# Patient Record
Sex: Male | Born: 1980 | Race: White | Hispanic: No | Marital: Single | Smoking: Former smoker
Health system: Southern US, Community
[De-identification: ages and names within clinical notes are randomized; demographics above are authoritative.]

## PROBLEM LIST (undated history)

## (undated) DIAGNOSIS — F32A Depression, unspecified: Secondary | ICD-10-CM

## (undated) DIAGNOSIS — F329 Major depressive disorder, single episode, unspecified: Secondary | ICD-10-CM

## (undated) DIAGNOSIS — R102 Pelvic and perineal pain: Secondary | ICD-10-CM

## (undated) DIAGNOSIS — G8929 Other chronic pain: Secondary | ICD-10-CM

## (undated) DIAGNOSIS — F419 Anxiety disorder, unspecified: Secondary | ICD-10-CM

## (undated) HISTORY — PX: REPLACEMENT TOTAL KNEE: SUR1224

---
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
## 2003-02-06 ENCOUNTER — Emergency Department (HOSPITAL_COMMUNITY): Admission: EM | Admit: 2003-02-06 | Discharge: 2003-02-07 | Payer: Self-pay | Admitting: *Deleted

## 2003-02-07 ENCOUNTER — Encounter: Payer: Self-pay | Admitting: *Deleted

## 2005-02-05 ENCOUNTER — Emergency Department (HOSPITAL_COMMUNITY): Admission: EM | Admit: 2005-02-05 | Discharge: 2005-02-05 | Payer: Self-pay | Admitting: Emergency Medicine

## 2007-02-20 ENCOUNTER — Emergency Department (HOSPITAL_COMMUNITY): Admission: EM | Admit: 2007-02-20 | Discharge: 2007-02-20 | Payer: Self-pay | Admitting: Family Medicine

## 2007-03-26 ENCOUNTER — Emergency Department (HOSPITAL_COMMUNITY): Admission: EM | Admit: 2007-03-26 | Discharge: 2007-03-26 | Payer: Self-pay | Admitting: Emergency Medicine

## 2008-10-04 ENCOUNTER — Emergency Department (HOSPITAL_COMMUNITY): Admission: EM | Admit: 2008-10-04 | Discharge: 2008-10-04 | Payer: Self-pay | Admitting: Emergency Medicine

## 2010-10-12 LAB — BASIC METABOLIC PANEL
BUN: 14 mg/dL (ref 6–23)
Chloride: 107 mEq/L (ref 96–112)
Creatinine, Ser: 1.01 mg/dL (ref 0.4–1.5)
Glucose, Bld: 89 mg/dL (ref 70–99)
Potassium: 3.9 mEq/L (ref 3.5–5.1)

## 2010-10-12 LAB — RAPID URINE DRUG SCREEN, HOSP PERFORMED
Amphetamines: NOT DETECTED
Barbiturates: NOT DETECTED
Cocaine: NOT DETECTED
Opiates: NOT DETECTED

## 2010-10-12 LAB — DIFFERENTIAL
Basophils Absolute: 0 10*3/uL (ref 0.0–0.1)
Eosinophils Relative: 3 % (ref 0–5)
Monocytes Absolute: 0.7 10*3/uL (ref 0.1–1.0)
Monocytes Relative: 7 % (ref 3–12)
Neutro Abs: 4.7 10*3/uL (ref 1.7–7.7)
Neutrophils Relative %: 50 % (ref 43–77)

## 2010-10-12 LAB — CBC
Hemoglobin: 16.2 g/dL (ref 13.0–17.0)
WBC: 9.4 10*3/uL (ref 4.0–10.5)

## 2010-10-12 LAB — ETHANOL: Alcohol, Ethyl (B): 32 mg/dL — ABNORMAL HIGH (ref 0–10)

## 2011-05-13 ENCOUNTER — Emergency Department (INDEPENDENT_AMBULATORY_CARE_PROVIDER_SITE_OTHER)
Admission: EM | Admit: 2011-05-13 | Discharge: 2011-05-13 | Disposition: A | Discharge status: Other Acute Inpatient Hospital | Payer: BC Managed Care – PPO | Source: Home / Self Care | Attending: Emergency Medicine | Admitting: Emergency Medicine

## 2011-05-13 ENCOUNTER — Encounter (HOSPITAL_COMMUNITY): Payer: Self-pay | Admitting: *Deleted

## 2011-05-13 DIAGNOSIS — R300 Dysuria: Secondary | ICD-10-CM

## 2011-05-13 LAB — POCT URINALYSIS DIP (DEVICE)
Glucose, UA: 100 mg/dL — AB
Nitrite: POSITIVE — AB
Protein, ur: 30 mg/dL — AB
Urobilinogen, UA: 2 mg/dL — ABNORMAL HIGH (ref 0.0–1.0)
pH: 5 (ref 5.0–8.0)

## 2011-05-13 MED ORDER — SEPTRA DS 800-160 MG PO TABS: 1.0000 | ORAL_TABLET | Freq: Two times a day (BID) | ORAL | Status: AC

## 2011-05-13 NOTE — ED Provider Notes (Signed)
History     CSN: [id number] Arrival date & time: 05/13/2011  2:56 PM   First MD Initiated Contact with Patient 05/13/11 1443      Chief Complaint  Patient presents with  . Urinary Frequency    pt with c/o urinary urgency - denies penile discharge -per pt small red area scrotum    (Consider location/radiation/quality/duration/timing/severity/associated sxs/prior treatment) HPI  History reviewed. No pertinent past medical history.  No past surgical history on file.  No family history on file.  History  Substance Use Topics  . Smoking status: Not on file  . Smokeless tobacco: Not on file  . Alcohol Use: Not on file      Review of Systems  Allergies  Review of patient's allergies indicates no known allergies.  Home Medications  No current outpatient prescriptions on file.  BP 148/90  Pulse 92  Temp(Src) 98.4 F (36.9 C) (Oral)  Resp 16  SpO2 97%  Physical Exam  ED Course  Procedures (including critical care time)  Labs Reviewed  POCT URINALYSIS DIP (DEVICE) - Abnormal; Notable for the following:    Glucose, UA 100 (*)    Bilirubin Urine SMALL (*)    Ketones, ur 15 (*)    Protein, ur 30 (*)    Urobilinogen, UA 2.0 (*)    Nitrite POSITIVE (*)    Leukocytes, UA LARGE (*) Biochemical Testing Only. Please order routine urinalysis from main lab if confirmatory testing is needed.   All other components within normal limits  GC/CHLAMYDIA PROBE AMP, GENITAL  POCT URINALYSIS DIPSTICK   No results found.   No diagnosis found.    MDM  Dysuria-        Jimmie Molly, MD 05/13/11 [phone]

## 2011-05-13 NOTE — Discharge Instructions (Signed)
Dysuria. As discussed we will contact you, if any abnormal culture results from urine and urethral studies- Dysuria is the medical term for pain with urination. There are many causes for dysuria, but urinary tract infection is the most common. If a urinalysis was performed it can show that there is a urinary tract infection. A urine culture confirms that you or your child is sick. You will need to follow up with a healthcare provider because:  If a urine culture was done you will need to know the culture results and treatment recommendations.   If the urine culture was positive, you or your child will need to be put on antibiotics or know if the antibiotics prescribed are the right antibiotics for your urinary tract infection.   If the urine culture is negative (no urinary tract infection), then other causes may need to be explored or antibiotics need to be stopped.  Today laboratory work may have been done and there does not seem to be an infection. If cultures were done they will take at least 24 to 48 hours to be completed. Today x-rays may have been taken and they read as normal. No cause can be found for the problems. The x-rays may be re-read by a radiologist and you will be contacted if additional findings are made. You or your child may have been put on medications to help with this problem until you can see your primary caregiver. If the problems get better, see your primary caregiver if the problems return. If you were given antibiotics (medications which kill germs), take all of the mediations as directed for the full course of treatment.  If laboratory work was done, you need to find the results. Leave a telephone number where you can be reached. If this is not possible, make sure you find out how you are to get test results. HOME CARE INSTRUCTIONS   Drink lots of fluids. For adults, drink eight, 8 ounce glasses of clear juice or water a day. For children, replace fluids as suggested by  your caregiver.   Empty the bladder often. Avoid holding urine for long periods of time.   After a bowel movement, women should cleanse front to back, using each tissue only once.   Empty your bladder before and after sexual intercourse.   Take all the medicine given to you until it is gone. You may feel better in a few days, but TAKE ALL MEDICINE.   Avoid caffeine, tea, alcohol and carbonated beverages, because they tend to irritate the bladder.   In men, alcohol may irritate the prostate.   Only take over-the-counter or prescription medicines for pain, discomfort, or fever as directed by your caregiver.   If your caregiver has given you a follow-up appointment, it is very important to keep that appointment. Not keeping the appointment could result in a chronic or permanent injury, pain, and disability. If there is any problem keeping the appointment, you must call back to this facility for assistance.  SEEK IMMEDIATE MEDICAL CARE IF:   Back pain develops.   A fever develops.   There is nausea (feeling sick to your stomach) or vomiting (throwing up).   Problems are no better with medications or are getting worse.  MAKE SURE YOU:   Understand these instructions.   Will watch your condition.   Will get help right away if you are not doing well or get worse.  Document Released: 03/17/2004 Document Revised: 03/01/2011 Document Reviewed: 01/23/2008 [employer] Patient Information 2012  [employer] °

## 2011-05-15 LAB — URINE CULTURE: Colony Count: NO GROWTH

## 2011-05-15 LAB — GC/CHLAMYDIA PROBE AMP, GENITAL: Chlamydia, DNA Probe: NEGATIVE

## 2011-05-17 ENCOUNTER — Telehealth (HOSPITAL_COMMUNITY): Payer: Self-pay | Admitting: *Deleted

## 2011-06-02 ENCOUNTER — Other Ambulatory Visit: Payer: Self-pay | Admitting: Urology

## 2011-06-02 DIAGNOSIS — M5126 Other intervertebral disc displacement, lumbar region: Secondary | ICD-10-CM

## 2011-06-02 DIAGNOSIS — N50819 Testicular pain, unspecified: Secondary | ICD-10-CM

## 2011-06-02 DIAGNOSIS — R200 Anesthesia of skin: Secondary | ICD-10-CM

## 2011-06-04 ENCOUNTER — Ambulatory Visit (HOSPITAL_COMMUNITY): Payer: BC Managed Care – PPO

## 2011-07-04 HISTORY — PX: COLONOSCOPY: SHX174

## 2011-07-04 HISTORY — PX: OTHER SURGICAL HISTORY: SHX169

## 2012-01-20 ENCOUNTER — Emergency Department (HOSPITAL_COMMUNITY)
Admission: EM | Admit: 2012-01-20 | Discharge: 2012-01-20 | Discharge status: Absent without leave | Payer: Self-pay | Source: Home / Self Care | Attending: Emergency Medicine | Admitting: Emergency Medicine

## 2012-01-20 ENCOUNTER — Encounter (HOSPITAL_COMMUNITY): Payer: Self-pay | Admitting: *Deleted

## 2012-01-20 ENCOUNTER — Emergency Department (HOSPITAL_COMMUNITY)
Admission: EM | Admit: 2012-01-20 | Discharge: 2012-01-20 | Disposition: A | Discharge status: Home / Self Care | Payer: Worker's Compensation | Attending: Emergency Medicine | Admitting: Emergency Medicine

## 2012-01-20 DIAGNOSIS — T310 Burns involving less than 10% of body surface: Secondary | ICD-10-CM

## 2012-01-20 DIAGNOSIS — T300 Burn of unspecified body region, unspecified degree: Secondary | ICD-10-CM

## 2012-01-20 DIAGNOSIS — X19XXXA Contact with other heat and hot substances, initial encounter: Secondary | ICD-10-CM | POA: Insufficient documentation

## 2012-01-20 DIAGNOSIS — T22119A Burn of first degree of unspecified forearm, initial encounter: Secondary | ICD-10-CM | POA: Insufficient documentation

## 2012-01-20 HISTORY — DX: Other chronic pain: G89.29

## 2012-01-20 HISTORY — DX: Pelvic and perineal pain: R10.2

## 2012-01-20 MED ORDER — NAPROXEN 500 MG PO TABS: 500.0000 mg | ORAL_TABLET | Freq: Two times a day (BID) | ORAL | Status: AC

## 2012-01-20 MED ORDER — HYDROCODONE-ACETAMINOPHEN 5-325 MG PO TABS: 1.0000 | ORAL_TABLET | Freq: Four times a day (QID) | ORAL | Status: AC | PRN

## 2012-01-20 MED ORDER — HYDROMORPHONE HCL PF 2 MG/ML IJ SOLN
2.0000 mg | Freq: Once | INTRAMUSCULAR | Status: AC
  Administered 2012-01-20: 2 mg via INTRAMUSCULAR
  Filled 2012-01-20: qty 2

## 2012-01-20 NOTE — ED Provider Notes (Signed)
History  Scribed for Shelda Jakes, MD, the patient was seen in room TR05C/TR05C. This chart was scribed by Candelaria Stagers. The patient's care started at 6:54 PM   CSN: [id number]  Arrival date & time 01/20/12  1734   First MD Initiated Contact with Patient 01/20/12 1820      Chief Complaint  Patient presents with  . Burn     The history is provided by the patient.   Andrew Taylor is a 31 y.o. male who presents to the Emergency Department complaining of a burn to his right forearm after a radiator exploded while at work.  He is experiencing no blistering and skin is intact.  Pt was wearing protective gear.   Past Medical History  Diagnosis Date  . Chronic male pelvic pain     History reviewed. No pertinent past surgical history.  History reviewed. No pertinent family history.  History  Substance Use Topics  . Smoking status: Not on file  . Smokeless tobacco: Not on file  . Alcohol Use: Yes     occ      Review of Systems  Skin:       Burn to his right forearm.   All other systems reviewed and are negative.    Allergies  Review of patient's allergies indicates no known allergies.  Home Medications   Current Outpatient Rx  Name Route Sig Dispense Refill  . ALPRAZOLAM ER 3 MG PO TB24 Oral Take 3 mg by mouth every morning.    Marland Kitchen AMITRIPTYLINE HCL 25 MG PO TABS Oral Take 50 mg by mouth at bedtime.    Marland Kitchen HYDROCODONE-ACETAMINOPHEN 5-325 MG PO TABS Oral Take 1-2 tablets by mouth every 6 (six) hours as needed for pain. 10 tablet 0  . NAPROXEN 500 MG PO TABS Oral Take 1 tablet (500 mg total) by mouth 2 (two) times daily. 14 tablet 0    BP 151/85  Pulse 105  Temp 97.9 F (36.6 C) (Oral)  Resp 19  SpO2 99%  Physical Exam  Nursing note and vitals reviewed. Constitutional: He is oriented to person, place, and time. He appears well-developed and well-nourished. No distress.  HENT:  Head: Normocephalic and atraumatic.  Eyes: EOM are normal.    Cardiovascular: Normal rate and regular rhythm.   No murmur heard. Pulmonary/Chest: Breath sounds normal. He has no wheezes. He has no rales.  Abdominal: Bowel sounds are normal. There is no tenderness.  Musculoskeletal: Normal range of motion. He exhibits no tenderness.  Neurological: He is alert and oriented to person, place, and time. No cranial nerve deficit.  Skin: He is not diaphoretic.       Burn to 3-4% of body surface located on the right arm.  No significant blistering.  Cap refill in fingers 1 sec to right hand.  Burn to posterior hypothenar.      Psychiatric: He has a normal mood and affect. His behavior is normal.    ED Course  Procedures   DIAGNOSTIC STUDIES: Oxygen Saturation is 99% on room air, normal by my interpretation.    COORDINATION OF CARE:     Labs Reviewed - No data to display No results found.   1. Burn any degree involving less than 10 percent of body surface   2. First degree burn       MDM  Right forearm first degree burn about 4% body surface area minimal involvement of the hand on the back of the hand between the thumb and index finger.  Rare blistering almost all predominantly first degree. Tetanus is up-to-date patient improved with pain medicine the emergency department patient will be sent home with pain medicine anti-inflammatories and precautions.     I personally performed the services described in this documentation, which was scribed in my presence. The recorded information has been reviewed and considered.          Shelda Jakes, MD 01/20/12 [phone]

## 2012-01-20 NOTE — ED Notes (Signed)
Pt has radiator burn to right arm from wrist to upper arm.

## 2012-08-30 ENCOUNTER — Emergency Department (HOSPITAL_COMMUNITY)
Admission: EM | Admit: 2012-08-30 | Discharge: 2012-08-30 | Disposition: A | Discharge status: Home / Self Care | Payer: BC Managed Care – PPO | Source: Home / Self Care | Attending: Emergency Medicine | Admitting: Emergency Medicine

## 2012-08-30 ENCOUNTER — Encounter (HOSPITAL_COMMUNITY): Payer: Self-pay | Admitting: Emergency Medicine

## 2012-08-30 DIAGNOSIS — L738 Other specified follicular disorders: Secondary | ICD-10-CM

## 2012-08-30 DIAGNOSIS — L739 Follicular disorder, unspecified: Secondary | ICD-10-CM

## 2012-08-30 MED ORDER — DOXYCYCLINE HYCLATE 100 MG PO TABS: 100.0000 mg | ORAL_TABLET | Freq: Two times a day (BID) | ORAL | Status: DC

## 2012-08-30 NOTE — ED Provider Notes (Signed)
Chief Complaint  Patient presents with  . Rash    History of Present Illness:   Andrew Taylor is a 32 year old male who sustained a small cut on his right index finger a week ago. This has healed up, but ever since then he's had some red bumps on his right hand, one in his chest, and is also concerned about a couple of lesions on his penis. He thinks these are warts which are are spreading from the cut in the hand. The lesions are little bit itchy but not painful. He denies any fever or chills. The wound on the index finger has healed up completely. He's had no other GU complaints including urethral discharge or ulcers on the penis. He is in a mutually monogamous relationship with one single male partner. The patient has chronic pelvic floor dysfunction with pain and difficulty in urinating. He is seeing a urologist at [facility] and takes multiple medications including Xanax, Cialis, Valium rectal suppositories, tramadol, and amitriptyline.  Review of Systems:  Other than noted above, the patient denies any of the following symptoms: Systemic:  No fever, chills, sweats, weight loss, or fatigue. ENT:  No nasal congestion, rhinorrhea, sore throat, swelling of lips, tongue or throat. Resp:  No cough, wheezing, or shortness of breath. Skin:  No rash, itching, nodules, or suspicious lesions.  PMFSH:  Past medical history, family history, social history, meds, and allergies were reviewed.  Physical Exam:   Vital signs:  BP 151/81  Pulse 94  Temp(Src) 98.1 F (36.7 C) (Oral)  Resp 16  SpO2 100% Gen:  Alert, oriented, in no distress. ENT:  Pharynx clear, no intraoral lesions, moist mucous membranes. Lungs:  Clear to auscultation. Skin:  The laceration the right index finger has healed up completely. He has a couple of red bumps on the dorsum of his right hand. He's been somewhat medicine on this so the skin around them as white and somewhat raised. He has a tiny red bump on his chest.  The lesions on his penis are almost microscopic. They are not warts. There tiny white areas of unknown significance. None of these lesions look like warts.  Other Labs Obtained at Urgent Care Center:  Since he is concerned about the possibility of HSV 1 HSV 2, titers were obtained.  Results are pending at this time and we will call about any positive results.  Assessment:  The encounter diagnosis was Folliculitis.  I think he may have mild folliculitis from bacteria when he had the cut, but nothing severe and definitely no warts.  Plan:   1.  The following meds were prescribed:   Discharge Medication List as of 08/30/2012  6:16 PM    START taking these medications   Details  doxycycline (VIBRA-TABS) 100 MG tablet Take 1 tablet (100 mg total) by mouth 2 (two) times daily., Starting 08/30/2012, Until Discontinued, Normal       2.  The patient was instructed in symptomatic care and handouts were given. 3.  The patient was told to return if becoming worse in any way, if no better in 3 or 4 days, and given some red flag symptoms that would indicate earlier return.     Reuben Likes, MD 08/30/12 [phone]

## 2012-08-30 NOTE — ED Notes (Addendum)
Had cut on his hand last Sat.  Next day he noted white round flat patch on R hand, base of R thumb, chest, and penis.  One on R index finger went away after using wart removal. C/o itching.

## 2012-09-02 LAB — HSV 2 ANTIBODY, IGG: HSV 2 Glycoprotein G Ab, IgG: <0.1 IV

## 2012-09-02 LAB — HSV 1 ANTIBODY, IGG: HSV 1 Glycoprotein G Ab, IgG: 0.1 IV

## 2012-09-03 ENCOUNTER — Telehealth (HOSPITAL_COMMUNITY): Payer: Self-pay | Admitting: *Deleted

## 2012-09-03 NOTE — ED Notes (Signed)
HSV 1 0.10 neg., HSV 2 <0.10 neg.  Labs sent to Dr. Lorenz Coaster. He asked me to notify pt. of neg. results due to his anxiety about it.  I called and verified with person that answered that I had dialed correctly and was told it was a wrong number. I called and left message with contact (father) for pt. to call. [name] 09/03/2012

## 2012-09-04 ENCOUNTER — Telehealth (HOSPITAL_COMMUNITY): Payer: Self-pay | Admitting: *Deleted

## 2012-09-09 NOTE — ED Notes (Signed)
Chart review.

## 2012-09-10 ENCOUNTER — Telehealth (HOSPITAL_COMMUNITY): Payer: Self-pay | Admitting: *Deleted

## 2012-09-10 NOTE — ED Notes (Signed)
Unable to reach pt. by phone.  Pt.'s mobile number incorrect.  Left message with contact x 3.  Letter sent informing pt. that his labs were negative. [name] 09/10/2012

## 2012-11-09 ENCOUNTER — Emergency Department (HOSPITAL_COMMUNITY): Payer: Worker's Compensation

## 2012-11-09 ENCOUNTER — Encounter (HOSPITAL_COMMUNITY): Payer: Self-pay | Admitting: Emergency Medicine

## 2012-11-09 ENCOUNTER — Emergency Department (HOSPITAL_COMMUNITY)
Admission: EM | Admit: 2012-11-09 | Discharge: 2012-11-09 | Disposition: A | Discharge status: Home / Self Care | Payer: Worker's Compensation | Attending: Emergency Medicine | Admitting: Emergency Medicine

## 2012-11-09 DIAGNOSIS — S838X9A Sprain of other specified parts of unspecified knee, initial encounter: Secondary | ICD-10-CM | POA: Insufficient documentation

## 2012-11-09 DIAGNOSIS — Y9289 Other specified places as the place of occurrence of the external cause: Secondary | ICD-10-CM | POA: Insufficient documentation

## 2012-11-09 DIAGNOSIS — G8929 Other chronic pain: Secondary | ICD-10-CM | POA: Insufficient documentation

## 2012-11-09 DIAGNOSIS — S83422A Sprain of lateral collateral ligament of left knee, initial encounter: Secondary | ICD-10-CM

## 2012-11-09 DIAGNOSIS — X500XXA Overexertion from strenuous movement or load, initial encounter: Secondary | ICD-10-CM | POA: Insufficient documentation

## 2012-11-09 DIAGNOSIS — S72412A Displaced unspecified condyle fracture of lower end of left femur, initial encounter for closed fracture: Secondary | ICD-10-CM

## 2012-11-09 DIAGNOSIS — Z79899 Other long term (current) drug therapy: Secondary | ICD-10-CM | POA: Insufficient documentation

## 2012-11-09 DIAGNOSIS — F411 Generalized anxiety disorder: Secondary | ICD-10-CM | POA: Insufficient documentation

## 2012-11-09 DIAGNOSIS — F329 Major depressive disorder, single episode, unspecified: Secondary | ICD-10-CM | POA: Insufficient documentation

## 2012-11-09 DIAGNOSIS — F3289 Other specified depressive episodes: Secondary | ICD-10-CM | POA: Insufficient documentation

## 2012-11-09 DIAGNOSIS — Y9389 Activity, other specified: Secondary | ICD-10-CM | POA: Insufficient documentation

## 2012-11-09 DIAGNOSIS — Z87891 Personal history of nicotine dependence: Secondary | ICD-10-CM | POA: Insufficient documentation

## 2012-11-09 DIAGNOSIS — S72413A Displaced unspecified condyle fracture of lower end of unspecified femur, initial encounter for closed fracture: Secondary | ICD-10-CM | POA: Insufficient documentation

## 2012-11-09 HISTORY — DX: Depression, unspecified: F32.A

## 2012-11-09 HISTORY — DX: Major depressive disorder, single episode, unspecified: F32.9

## 2012-11-09 HISTORY — DX: Anxiety disorder, unspecified: F41.9

## 2012-11-09 MED ORDER — HYDROCODONE-ACETAMINOPHEN 5-325 MG PO TABS
1.0000 | ORAL_TABLET | Freq: Once | ORAL | Status: AC
  Administered 2012-11-09: 1 via ORAL
  Filled 2012-11-09: qty 1

## 2012-11-09 MED ORDER — HYDROCODONE-ACETAMINOPHEN 5-325 MG PO TABS: 1.0000 | ORAL_TABLET | ORAL | Status: DC | PRN

## 2012-11-09 NOTE — ED Provider Notes (Signed)
History     CSN: [id number]  Arrival date & time 11/09/12  1258   First MD Initiated Contact with Patient 11/09/12 1333      Chief Complaint  Patient presents with  . Knee Pain    r/knee pain x 3 hrs. Pt stated that r/knee pain occured after he turned sharply to the right and knee did not move    (Consider location/radiation/quality/duration/timing/severity/associated sxs/prior treatment) HPI  This is a 32 year old male presenting to the emergency department after injuring his right knee at work. Patient states he was standing up working on a car when he turned to grab a tool and felt his right knee nocturnal with him. He states he heard a loud pop and immediately fell to the ground. Patient states he was unable to stand after the injury. He had sharp pain in the center of his knee without radiation that rates it a 9/10. He noticed immediate swelling without bruising. Denies any previous injury to knee. Came immediately to the emergency department.   Past Medical History  Diagnosis Date  . Chronic male pelvic pain   . Anxiety   . Depression     Past Surgical History  Procedure Laterality Date  . Colonoscopy  2013  . Peniloscopy  2013    Family History  Problem Relation Age of Onset  . Hypertension Father     History  Substance Use Topics  . Smoking status: Former Games developer  . Smokeless tobacco: Not on file  . Alcohol Use: Yes     Comment: occ      Review of Systems  Musculoskeletal:       Knee pain  All other systems reviewed and are negative.    Allergies  Review of patient's allergies indicates no known allergies.  Home Medications   Current Outpatient Rx  Name  Route  Sig  Dispense  Refill  . ALPRAZolam (XANAX XR) 3 MG 24 hr tablet   Oral   Take 3 mg by mouth every morning.         Marland Kitchen amitriptyline (ELAVIL) 25 MG tablet   Oral   Take 50 mg by mouth at bedtime.         . cyclobenzaprine (FLEXERIL) 10 MG tablet   Oral   Take 10 mg by mouth 3  (three) times daily as needed for muscle spasms.         Marland Kitchen doxycycline (VIBRA-TABS) 100 MG tablet   Oral   Take 1 tablet (100 mg total) by mouth 2 (two) times daily.   20 tablet   0   . tadalafil (CIALIS) 5 MG tablet   Oral   Take 5 mg by mouth daily as needed for erectile dysfunction.         . traMADol (ULTRAM) 50 MG tablet   Oral   Take 50 mg by mouth every 6 (six) hours as needed for pain.         . naproxen (NAPROSYN) 500 MG tablet   Oral   Take 1 tablet (500 mg total) by mouth 2 (two) times daily.   14 tablet   0     BP 129/93  Pulse 100  Temp(Src) 98.2 F (36.8 C)  Resp 20  Wt 199 lb (90.266 kg)  SpO2 100%  Physical Exam  Constitutional: He is oriented to person, place, and time. He appears well-developed and well-nourished.  HENT:  Head: Normocephalic and atraumatic.  Eyes: EOM are normal. Pupils are equal, round, and reactive  to light.  Cardiovascular: Normal rate, regular rhythm and normal heart sounds.   Pulmonary/Chest: Effort normal and breath sounds normal.  Abdominal: Soft. Bowel sounds are normal.  Musculoskeletal:       Right knee: He exhibits decreased range of motion, swelling, LCL laxity and bony tenderness. He exhibits no ecchymosis, no deformity, no laceration and no erythema. Tenderness found. LCL tenderness noted.       Left knee: Normal.       Right ankle: Normal.       Left ankle: Normal.  Unable to bear weight on right leg.   Neurological: He is alert and oriented to person, place, and time.  Skin: Skin is warm and dry.  Psychiatric: He has a normal mood and affect.    ED Course  Procedures (including critical care time)  Labs Reviewed - No data to display Dg Knee Complete 4 Views Right  11/09/2012  *RADIOLOGY REPORT*  Clinical Data: Twisting injury to right knee with pain and inability to bear weight.  RIGHT KNEE - COMPLETE 4+ VIEW  Comparison: None.  Findings: There is an avulsion fracture adjacent to the lateral femoral  condyle, likely reflecting injury at the level of the lateral collateral ligament insertion.  There is associated joint fluid in the suprapatellar region.  No other injuries are identified.  IMPRESSION: Lateral femoral condyle avulsion fracture, likely at the level of LCL insertion.   Original Report Authenticated By: Irish Lack, M.D.      1. Knee LCL sprain, left, initial encounter   2. Femoral condyle fracture, left, closed, initial encounter       MDM  Patient X-Ray positive for femoral condylar avulsion fracture. Pain managed in ED. Pt advised to follow up with orthopedics for followup. Patient given brace and crutches while in ED, DC'd with home pain meds, discussed rice protocol and discussed importance of following up with orthopedist. Patient will be dc home & is agreeable with above plan.         Jeannetta Ellis, PA-C 11/09/12 [phone]

## 2012-11-09 NOTE — ED Notes (Signed)
C/o r/ knee pain after he twisted it at work. Pain caused him to fall onto r/knee and shoulder. Abrasion on r/hand noted

## 2012-11-09 NOTE — ED Notes (Signed)
[id number]> Expected date:<BR> Expected time:<BR> Means of arrival:<BR> Comments:<BR> Hold for ems patient

## 2012-11-09 NOTE — ED Notes (Signed)
Patient transported to X-ray 

## 2012-11-10 NOTE — ED Provider Notes (Signed)
Medical screening examination/treatment/procedure(s) were performed by non-physician practitioner and as supervising physician I was immediately available for consultation/collaboration.  Nedda Gains, MD 11/10/12 0819 

## 2014-11-15 ENCOUNTER — Encounter (HOSPITAL_BASED_OUTPATIENT_CLINIC_OR_DEPARTMENT_OTHER): Payer: Self-pay | Admitting: *Deleted

## 2014-11-15 ENCOUNTER — Emergency Department (HOSPITAL_BASED_OUTPATIENT_CLINIC_OR_DEPARTMENT_OTHER)
Admission: EM | Admit: 2014-11-15 | Discharge: 2014-11-15 | Disposition: A | Discharge status: Home / Self Care | Payer: BLUE CROSS/BLUE SHIELD | Attending: Emergency Medicine | Admitting: Emergency Medicine

## 2014-11-15 ENCOUNTER — Emergency Department (HOSPITAL_BASED_OUTPATIENT_CLINIC_OR_DEPARTMENT_OTHER): Payer: BLUE CROSS/BLUE SHIELD

## 2014-11-15 DIAGNOSIS — R0602 Shortness of breath: Secondary | ICD-10-CM | POA: Insufficient documentation

## 2014-11-15 DIAGNOSIS — F329 Major depressive disorder, single episode, unspecified: Secondary | ICD-10-CM | POA: Diagnosis not present

## 2014-11-15 DIAGNOSIS — R000 Tachycardia, unspecified: Secondary | ICD-10-CM | POA: Diagnosis not present

## 2014-11-15 DIAGNOSIS — M79669 Pain in unspecified lower leg: Secondary | ICD-10-CM | POA: Diagnosis present

## 2014-11-15 DIAGNOSIS — R61 Generalized hyperhidrosis: Secondary | ICD-10-CM | POA: Diagnosis not present

## 2014-11-15 DIAGNOSIS — Z79899 Other long term (current) drug therapy: Secondary | ICD-10-CM | POA: Insufficient documentation

## 2014-11-15 DIAGNOSIS — G8929 Other chronic pain: Secondary | ICD-10-CM | POA: Insufficient documentation

## 2014-11-15 DIAGNOSIS — M791 Myalgia, unspecified site: Secondary | ICD-10-CM

## 2014-11-15 DIAGNOSIS — Z87891 Personal history of nicotine dependence: Secondary | ICD-10-CM | POA: Diagnosis not present

## 2014-11-15 DIAGNOSIS — F419 Anxiety disorder, unspecified: Secondary | ICD-10-CM | POA: Diagnosis not present

## 2014-11-15 LAB — CBC WITH DIFFERENTIAL/PLATELET
BASOS ABS: 0 10*3/uL (ref 0.0–0.1)
Basophils Relative: 0 % (ref 0–1)
EOS ABS: 0 10*3/uL (ref 0.0–0.7)
Eosinophils Relative: 0 % (ref 0–5)
HEMATOCRIT: 50 % (ref 39.0–52.0)
Hemoglobin: 17.2 g/dL — ABNORMAL HIGH (ref 13.0–17.0)
Lymphocytes Relative: 13 % (ref 12–46)
Lymphs Abs: 0.7 10*3/uL (ref 0.7–4.0)
MCH: 30.4 pg (ref 26.0–34.0)
MCHC: 34.4 g/dL (ref 30.0–36.0)
MCV: 88.5 fL (ref 78.0–100.0)
MONOS PCT: 8 % (ref 3–12)
Monocytes Absolute: 0.4 10*3/uL (ref 0.1–1.0)
Neutro Abs: 4.2 10*3/uL (ref 1.7–7.7)
Neutrophils Relative %: 79 % — ABNORMAL HIGH (ref 43–77)
PLATELETS: 111 10*3/uL — AB (ref 150–400)
RBC: 5.65 MIL/uL (ref 4.22–5.81)
RDW: 13 % (ref 11.5–15.5)
WBC: 5.3 10*3/uL (ref 4.0–10.5)

## 2014-11-15 LAB — COMPREHENSIVE METABOLIC PANEL
ALK PHOS: 49 U/L (ref 38–126)
ALT: 55 U/L (ref 17–63)
AST: 41 U/L (ref 15–41)
Albumin: 4.9 g/dL (ref 3.5–5.0)
Anion gap: 14 (ref 5–15)
BUN: 15 mg/dL (ref 6–20)
CO2: 27 mmol/L (ref 22–32)
CREATININE: 1.27 mg/dL — AB (ref 0.61–1.24)
Calcium: 10.1 mg/dL (ref 8.9–10.3)
Chloride: 98 mmol/L — ABNORMAL LOW (ref 101–111)
GFR calc Af Amer: >60 mL/min (ref >60)
GFR calc non Af Amer: >60 mL/min (ref >60)
Glucose, Bld: 115 mg/dL — ABNORMAL HIGH (ref 65–99)
Potassium: 3.9 mmol/L (ref 3.5–5.1)
Sodium: 139 mmol/L (ref 135–145)
Total Bilirubin: 0.7 mg/dL (ref 0.3–1.2)
Total Protein: 7.9 g/dL (ref 6.5–8.1)

## 2014-11-15 LAB — URINALYSIS, ROUTINE W REFLEX MICROSCOPIC
BILIRUBIN URINE: NEGATIVE
GLUCOSE, UA: NEGATIVE mg/dL
Hgb urine dipstick: NEGATIVE
KETONES UR: 15 mg/dL — AB
Leukocytes, UA: NEGATIVE
Nitrite: NEGATIVE
Protein, ur: NEGATIVE mg/dL
Specific Gravity, Urine: 1.028 (ref 1.005–1.030)
Urobilinogen, UA: 0.2 mg/dL (ref 0.0–1.0)
pH: 5.5 (ref 5.0–8.0)

## 2014-11-15 LAB — CK: Total CK: 89 U/L (ref 49–397)

## 2014-11-15 MED ORDER — KETOROLAC TROMETHAMINE 30 MG/ML IJ SOLN
30.0000 mg | Freq: Once | INTRAMUSCULAR | Status: AC
Start: 2014-11-15 — End: 2014-11-15
  Administered 2014-11-15: 30 mg via INTRAVENOUS
  Filled 2014-11-15: qty 1

## 2014-11-15 MED ORDER — NAPROXEN 500 MG PO TABS: 500.0000 mg | ORAL_TABLET | Freq: Two times a day (BID) | ORAL | Status: AC

## 2014-11-15 MED ORDER — CYCLOBENZAPRINE HCL 10 MG PO TABS: 10.0000 mg | ORAL_TABLET | Freq: Three times a day (TID) | ORAL | Status: AC | PRN

## 2014-11-15 MED ORDER — SODIUM CHLORIDE 0.9 % IV BOLUS (SEPSIS)
1000.0000 mL | Freq: Once | INTRAVENOUS | Status: AC
  Administered 2014-11-15: 1000 mL via INTRAVENOUS

## 2014-11-15 MED ORDER — MORPHINE SULFATE 4 MG/ML IJ SOLN
4.0000 mg | Freq: Once | INTRAMUSCULAR | Status: AC
  Administered 2014-11-15: 4 mg via INTRAVENOUS
  Filled 2014-11-15: qty 1

## 2014-11-15 MED ORDER — HYDROCODONE-ACETAMINOPHEN 5-325 MG PO TABS: 1.0000 | ORAL_TABLET | ORAL | Status: DC | PRN

## 2014-11-15 NOTE — ED Provider Notes (Signed)
CSN: [id number]     Arrival date & time 11/15/14  0725 History   First MD Initiated Contact with Patient 11/15/14 0732     Chief Complaint  Patient presents with  . lower body pain      (Consider location/radiation/quality/duration/timing/severity/associated sxs/prior Treatment) HPI 34 year old male presents with pelvic, buttock, and lower leg pain for the past 4 days. 6 days ago he felt a stinging sensation on his left shoulder and thought something bit him. He never noticed a mark or any bugs. He's been having itching in his shoulder and since then and fatigue that has been worsening over the past 1 week. Went to urgent care was diagnosed with shingles of his left shoulder (has never had a rash or burning pain). Started on valacyclovir. Patient change and he noticed some burning sensation to his right testicle and went back to the urgent care and was diagnosed with herpes. He states he has never had any rash or lesions and his genitalia. No burning with urination. States that urgent care took a blood test to confirm herpes. He has not received the results of this. He states that he's been having diffuse myalgias, fatigue, and shortness of breath on exertion. No chest pain, cough, or abdominal pain. Has been taking hydrocodone that he was given with minimal relief. Took some leftover oxycodone and states that did help until more off.  Past Medical History  Diagnosis Date  . Chronic male pelvic pain   . Anxiety   . Depression    Past Surgical History  Procedure Laterality Date  . Colonoscopy  2013  . Peniloscopy  2013  . Replacement total knee Right    Family History  Problem Relation Age of Onset  . Hypertension Father    History  Substance Use Topics  . Smoking status: Former Games developer  . Smokeless tobacco: Not on file  . Alcohol Use: Yes     Comment: occ    Review of Systems  Constitutional: Positive for diaphoresis and fatigue. Negative for fever.  Respiratory: Positive for  shortness of breath. Negative for cough.   Cardiovascular: Negative for chest pain and leg swelling.  Gastrointestinal: Negative for vomiting and abdominal pain.  Genitourinary: Negative for dysuria, discharge, penile pain and testicular pain.  Musculoskeletal: Positive for back pain.  Neurological: Negative for numbness.  All other systems reviewed and are negative.     Allergies  Review of patient's allergies indicates no known allergies.  Home Medications   Prior to Admission medications   Medication Sig Start Date End Date Taking? Authorizing Provider  hydrOXYzine (ATARAX/VISTARIL) 25 MG tablet Take 25 mg by mouth 3 (three) times daily as needed.   Yes Historical Provider, MD  valACYclovir (VALTREX) 1000 MG tablet Take 1,000 mg by mouth 3 (three) times daily.   Yes Historical Provider, MD  ALPRAZolam (XANAX XR) 3 MG 24 hr tablet Take 3 mg by mouth every morning.    Historical Provider, MD  amitriptyline (ELAVIL) 25 MG tablet Take 50 mg by mouth at bedtime.    Historical Provider, MD  cyclobenzaprine (FLEXERIL) 10 MG tablet Take 10 mg by mouth 3 (three) times daily as needed for muscle spasms.    Historical Provider, MD  doxycycline (VIBRA-TABS) 100 MG tablet Take 1 tablet (100 mg total) by mouth 2 (two) times daily. 08/30/12   Reuben Likes, MD  HYDROcodone-acetaminophen (NORCO/VICODIN) 5-325 MG per tablet Take 1-2 tablets by mouth every 4 (four) hours as needed for pain. 11/09/12  Jennifer Piepenbrink, PA-C  tadalafil (CIALIS) 5 MG tablet Take 5 mg by mouth daily as needed for erectile dysfunction.    Historical Provider, MD  traMADol (ULTRAM) 50 MG tablet Take 50 mg by mouth every 6 (six) hours as needed for pain.    Historical Provider, MD   BP 131/83 mmHg  Pulse 120  Temp(Src) 98.2 F (36.8 C) (Oral)  Resp 20  Ht 6\' 3"  (1.905 m)  Wt 210 lb (95.255 kg)  BMI 26.25 kg/m2  SpO2 100% Physical Exam  Constitutional: He is oriented to person, place, and time. He appears  well-developed and well-nourished.  HENT:  Head: Normocephalic and atraumatic.  Right Ear: External ear normal.  Left Ear: External ear normal.  Nose: Nose normal.  Eyes: Right eye exhibits no discharge. Left eye exhibits no discharge.  Neck: Neck supple.  Cardiovascular: Regular rhythm, normal heart sounds and intact distal pulses.  Tachycardia present.   Pulmonary/Chest: Effort normal and breath sounds normal. He has no wheezes. He has no rales.  Abdominal: Soft. There is no tenderness.  Musculoskeletal: He exhibits no edema.       Cervical back: He exhibits no tenderness.       Thoracic back: He exhibits no tenderness.       Lumbar back: He exhibits no tenderness.  No focal tenderness or swelling to large muscle groups Left shoulder appears normal, no swelling, rash or bites  Neurological: He is alert and oriented to person, place, and time.  Normal strength and sensation in lower extremities  Skin: Skin is warm and dry.  Nursing note and vitals reviewed.   ED Course  Procedures (including critical care time) Labs Review Labs Reviewed  COMPREHENSIVE METABOLIC PANEL - Abnormal; Notable for the following:    Chloride 98 (*)    Glucose, Bld 115 (*)    Creatinine, Ser 1.27 (*)    All other components within normal limits  CBC WITH DIFFERENTIAL/PLATELET - Abnormal; Notable for the following:    Hemoglobin 17.2 (*)    Platelets 111 (*)    Neutrophils Relative % 79 (*)    All other components within normal limits  URINALYSIS, ROUTINE W REFLEX MICROSCOPIC - Abnormal; Notable for the following:    Color, Urine AMBER (*)    Ketones, ur 15 (*)    All other components within normal limits  URINE CULTURE  CK    Imaging Review Dg Chest 2 View  11/15/2014   CLINICAL DATA:  Shortness of breath.  EXAM: CHEST  2 VIEW  COMPARISON:  February 05, 2005.  FINDINGS: The heart size and mediastinal contours are within normal limits. Both lungs are clear. No pneumothorax or pleural effusion is  noted. The visualized skeletal structures are unremarkable.  IMPRESSION: No active cardiopulmonary disease.   Electronically Signed   By: Lupita RaiderJames  Green Jr, M.D.   On: 11/15/2014 08:39     EKG Interpretation   Date/Time:  Sunday Nov 15 2014 08:07:57 EDT Ventricular Rate:  105 PR Interval:  140 QRS Duration: 98 QT Interval:  332 QTC Calculation: 438 R Axis:   13 Text Interpretation:  Sinus tachycardia Otherwise normal ECG No old  tracing to compare Confirmed by Christen Wardrop  MD, Brendalee Matthies ([id number]) on 11/15/2014  8:15:15 AM      MDM   Final diagnoses:  Myalgia    Is unclear where the patient's diffuse back and leg pain is coming from. His fatigue is also nonspecific. No bites or insect stings seen, and he never  pulled a tick off this I doubt tickborne illness. His unclear wires and care treated for shingles, I see know rash and he has not had a rash for over 1 week. He is mostly complaining of proximal thigh pain without weakness or numbness. No focal tenderness and CK is normal. Pain better controlled in ED, and given IV fluids for mild dehydration. Will recommend f/u with PCP given no obvious acute emergencies and treat symptomatically.    Pricilla LovelessScott Jizelle Conkey, MD 11/15/14 [phone]

## 2014-11-15 NOTE — ED Notes (Signed)
Patient c/o pelvic pain that radiates to buttocks/back and upper legs, stinging & burning. [facility] and diagnosed with shingles and given prescription but no relief. Some burning with urination.

## 2014-11-16 LAB — URINE CULTURE
Colony Count: NO GROWTH
Culture: NO GROWTH

## 2015-02-24 ENCOUNTER — Other Ambulatory Visit: Payer: Self-pay | Admitting: Family Medicine

## 2015-02-24 DIAGNOSIS — R1084 Generalized abdominal pain: Secondary | ICD-10-CM

## 2015-03-02 ENCOUNTER — Other Ambulatory Visit: Payer: Self-pay

## 2015-03-05 ENCOUNTER — Ambulatory Visit
Admission: RE | Admit: 2015-03-05 | Discharge: 2015-03-05 | Disposition: A | Discharge status: Home / Self Care | Payer: BLUE CROSS/BLUE SHIELD | Source: Ambulatory Visit | Attending: Family Medicine | Admitting: Family Medicine

## 2015-03-05 DIAGNOSIS — R1084 Generalized abdominal pain: Secondary | ICD-10-CM

## 2015-05-24 ENCOUNTER — Other Ambulatory Visit: Payer: Self-pay | Admitting: Family Medicine

## 2015-05-24 ENCOUNTER — Other Ambulatory Visit (HOSPITAL_COMMUNITY): Payer: Self-pay | Admitting: Family Medicine

## 2015-05-24 DIAGNOSIS — R1310 Dysphagia, unspecified: Secondary | ICD-10-CM

## 2015-05-28 ENCOUNTER — Ambulatory Visit (HOSPITAL_COMMUNITY)
Admission: RE | Admit: 2015-05-28 | Discharge: 2015-05-28 | Disposition: A | Discharge status: Home / Self Care | Payer: BLUE CROSS/BLUE SHIELD | Source: Ambulatory Visit | Attending: Family Medicine | Admitting: Family Medicine

## 2015-05-28 DIAGNOSIS — K219 Gastro-esophageal reflux disease without esophagitis: Secondary | ICD-10-CM | POA: Insufficient documentation

## 2015-05-28 DIAGNOSIS — R1314 Dysphagia, pharyngoesophageal phase: Secondary | ICD-10-CM | POA: Insufficient documentation

## 2015-05-28 DIAGNOSIS — R1310 Dysphagia, unspecified: Secondary | ICD-10-CM

## 2015-06-01 ENCOUNTER — Other Ambulatory Visit: Payer: BLUE CROSS/BLUE SHIELD

## 2015-06-07 ENCOUNTER — Ambulatory Visit (INDEPENDENT_AMBULATORY_CARE_PROVIDER_SITE_OTHER): Payer: BLUE CROSS/BLUE SHIELD | Admitting: Podiatry

## 2015-06-07 ENCOUNTER — Encounter: Payer: Self-pay | Admitting: Podiatry

## 2015-06-07 VITALS — BP 128/86 | HR 66 | Resp 16

## 2015-06-07 DIAGNOSIS — B070 Plantar wart: Secondary | ICD-10-CM | POA: Diagnosis not present

## 2015-06-07 DIAGNOSIS — B078 Other viral warts: Secondary | ICD-10-CM

## 2015-06-07 DIAGNOSIS — B079 Viral wart, unspecified: Secondary | ICD-10-CM

## 2015-06-07 NOTE — Progress Notes (Signed)
   Subjective:    Patient ID: Andrew Taylor, male    DOB: 04/07/1981, 34 y.o.   MRN: [id number]  HPI Pt presents with painful lesion on the bottom of his right foot, ongoing severe pain, they tried freezing and at home treatment, salycic acid with no relief,   Review of Systems  Genitourinary: Positive for urgency and difficulty urinating.  All other systems reviewed and are negative.      Objective:   Physical Exam        Assessment & Plan:

## 2015-06-07 NOTE — Patient Instructions (Signed)

## 2015-06-08 ENCOUNTER — Telehealth: Payer: Self-pay | Admitting: *Deleted

## 2015-06-08 DIAGNOSIS — B079 Viral wart, unspecified: Secondary | ICD-10-CM

## 2015-06-08 DIAGNOSIS — B078 Other viral warts: Secondary | ICD-10-CM

## 2015-06-08 NOTE — Progress Notes (Signed)
Subjective:     Patient ID: Andrew Taylor, male   DOB: 1980/08/17, 34 y.o.   MRN: [id number]  HPI patient presents stating he's had this lesion on the bottom of his right foot for several years and it seems to be getting gradually worse and it's painful and makes it hard for him to walk. He has tried salicylic acid and freezing without relief   Review of Systems  All other systems reviewed and are negative.      Objective:   Physical Exam  Constitutional: He is oriented to person, place, and time.  Cardiovascular: Intact distal pulses.   Musculoskeletal: Normal range of motion.  Neurological: He is oriented to person, place, and time.  Skin: Skin is warm.  Nursing note and vitals reviewed.  neurovascular status intact muscle strength adequate range of motion within normal limits with patient having a large 1.5 cm x 1.2 cm lesion plantar aspect right lateral foot that upon debridement shows pinpoint bleeding and pain to lateral pressure. Good digital perfusion is noted     Assessment:     Probable verruca plantaris plantar aspect right foot    Plan:     H&P condition reviewed and treatment options discussed. Patient wants it excised and I explained the procedure and the fact there is no long-term guarantees. Patient at this time was anesthetized with 60 mg Xylocaine with epinephrine sterile prep was applied and the mass was removed in toto and phenol applied to the base along with sterile dressing. Gave instructions on soaks and padding and sent this for pathology and reappoint as needed

## 2015-06-08 NOTE — Telephone Encounter (Addendum)
Dr. Charlsie Merlesegal removed right plantar foot lesion.  06/28/2015 - Informed pt, Dr. Charlsie Merlesegal reviewed biopsy results as a plantar wart.  Pt states it is still sore in the area of the wart but no redness, or draining.

## 2015-07-12 ENCOUNTER — Encounter: Payer: Self-pay | Admitting: Podiatry

## 2019-01-15 ENCOUNTER — Other Ambulatory Visit: Payer: Self-pay | Admitting: Internal Medicine

## 2020-02-17 ENCOUNTER — Other Ambulatory Visit: Payer: BLUE CROSS/BLUE SHIELD

## 2021-07-05 ENCOUNTER — Other Ambulatory Visit: Payer: Self-pay | Admitting: Family Medicine

## 2021-07-05 ENCOUNTER — Ambulatory Visit
Admission: RE | Admit: 2021-07-05 | Discharge: 2021-07-05 | Disposition: A | Discharge status: Home / Self Care | Payer: Managed Care, Other (non HMO) | Source: Ambulatory Visit | Attending: Family Medicine | Admitting: Family Medicine

## 2021-07-05 ENCOUNTER — Other Ambulatory Visit: Payer: Self-pay

## 2021-07-05 DIAGNOSIS — R0602 Shortness of breath: Secondary | ICD-10-CM

## 2022-10-03 IMAGING — CR DG CHEST 2V
2 series · 2 of 2 positions shown · non-contrast
Comparison: 11/15/2014

CLINICAL DATA: SOB, cough for approx 1 yr, works around metal
particles, exsmoker

EXAM:
CHEST - 2 VIEW

[w chest pa]
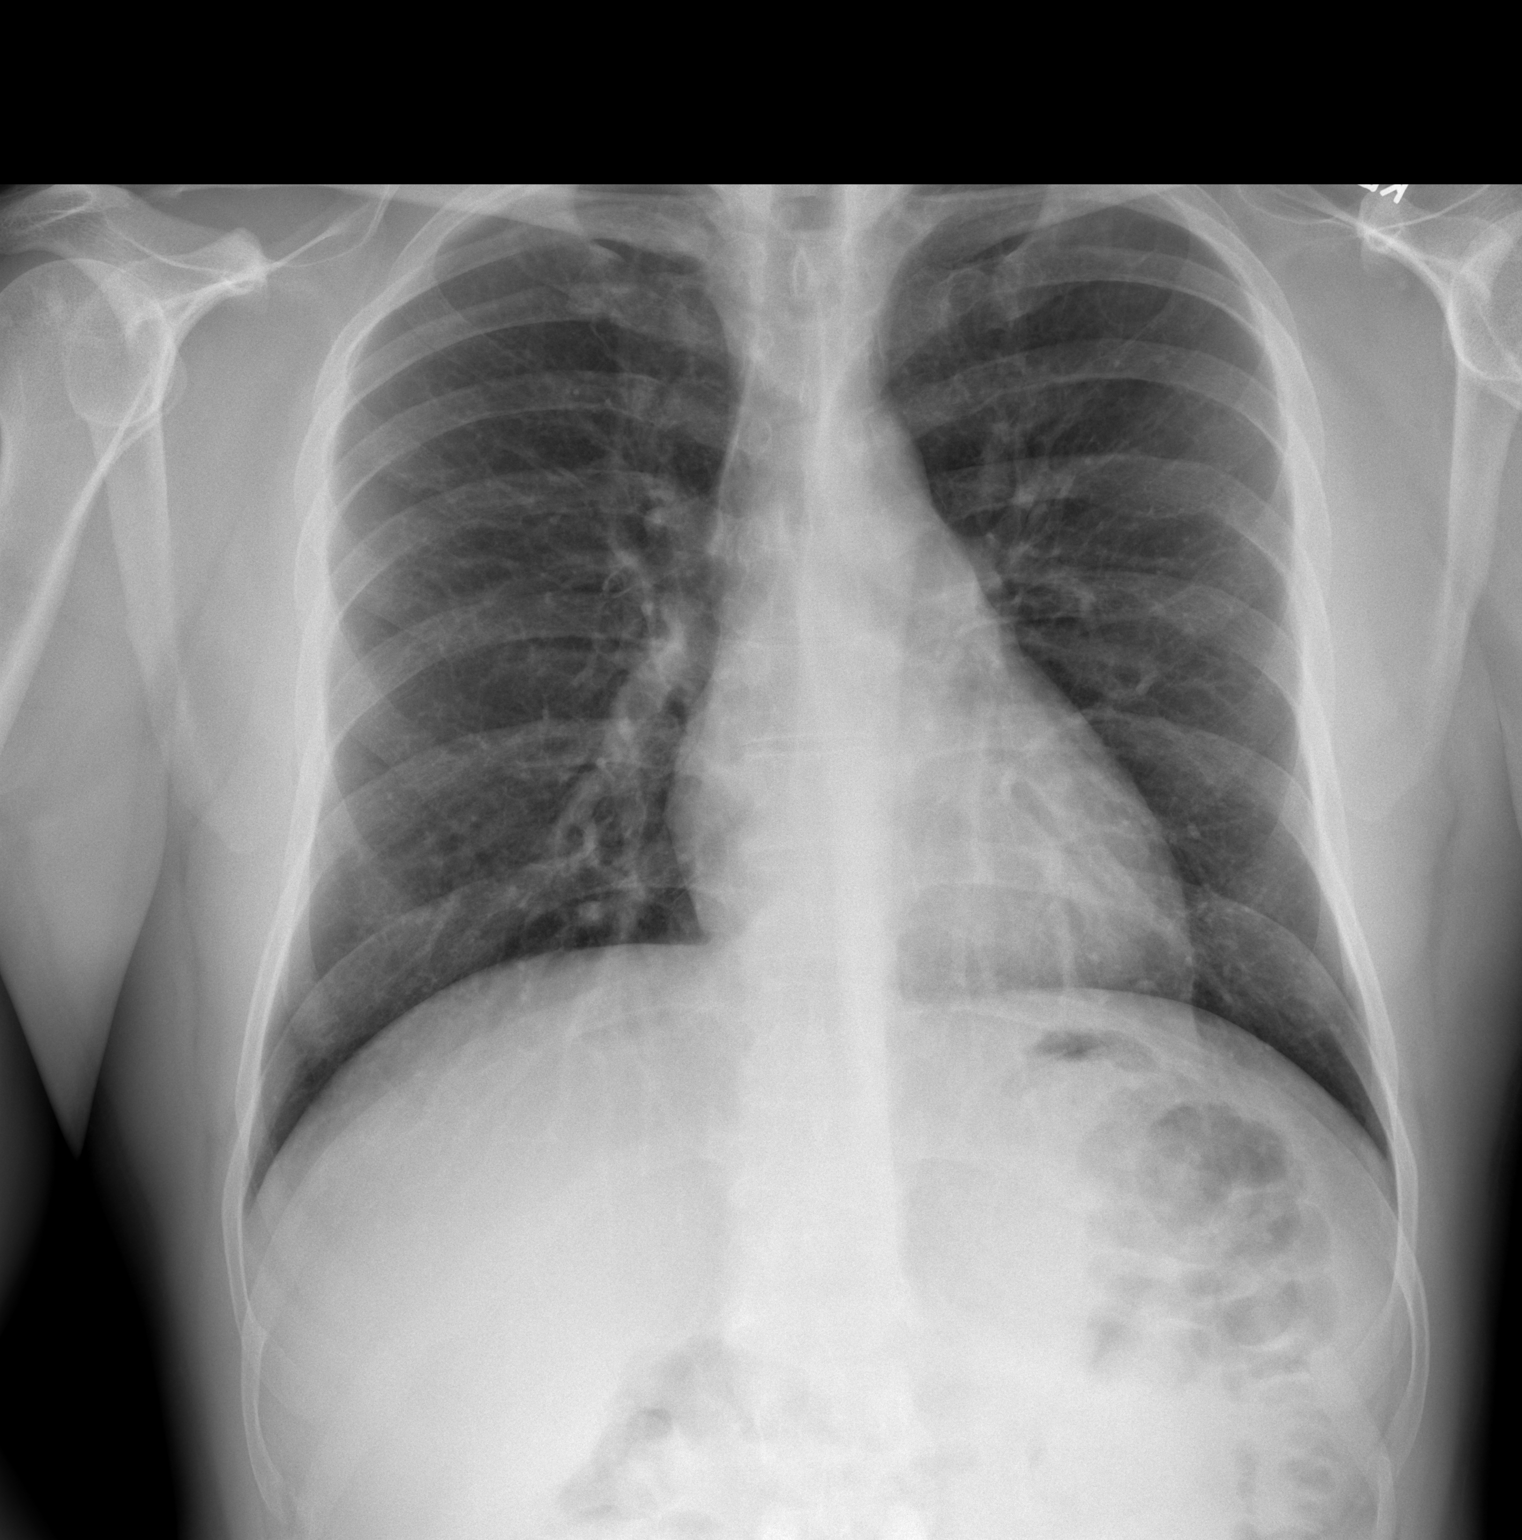

[w chest lat]
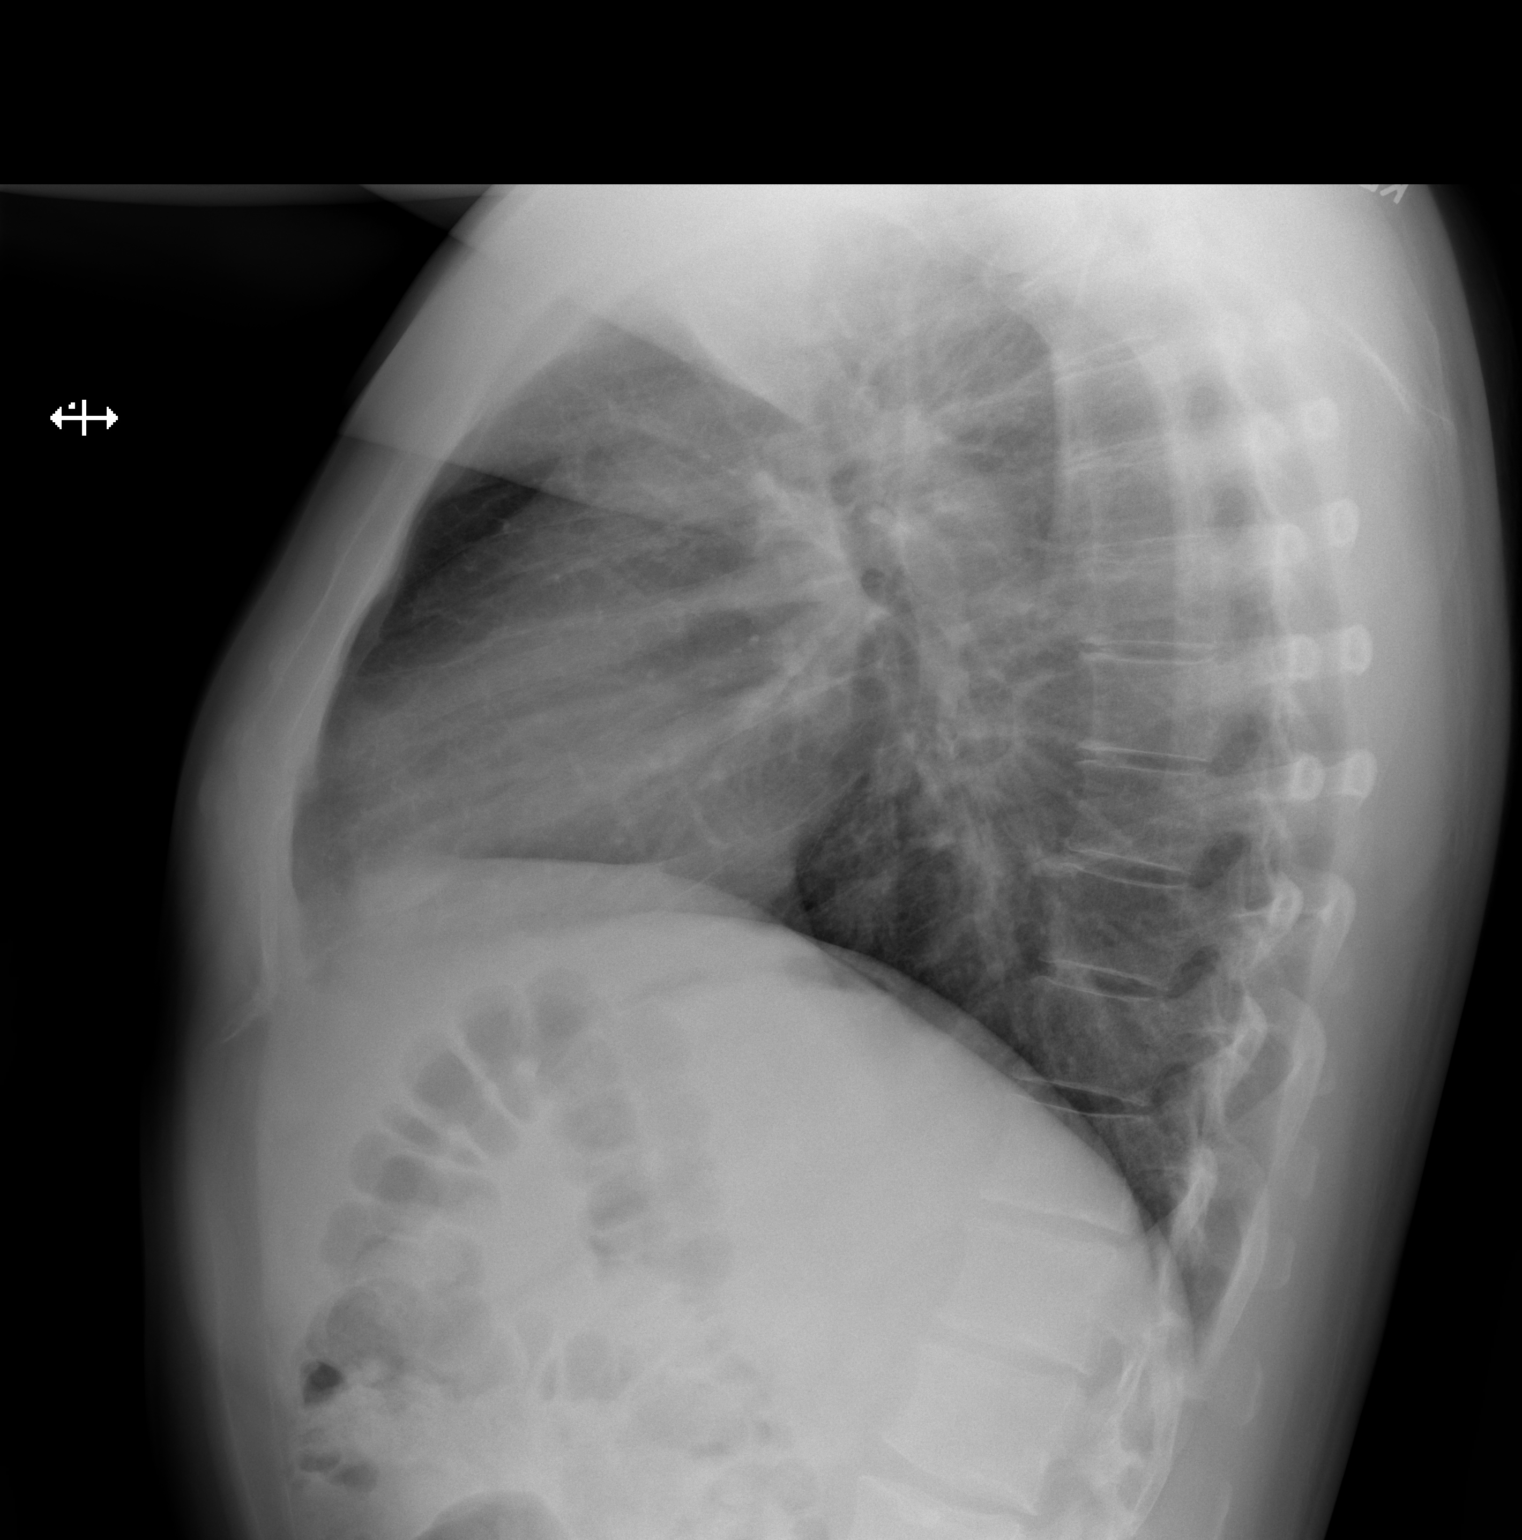

[2 of 2 positions shown; findings below may reference images not displayed]

FINDINGS: Lungs are clear.

Heart size and mediastinal contours are within normal limits.

No effusion.

Visualized bones unremarkable.
IMPRESSION: No acute cardiopulmonary disease.

## 2023-08-07 ENCOUNTER — Other Ambulatory Visit: Payer: Self-pay | Admitting: Family Medicine

## 2023-08-07 DIAGNOSIS — R1084 Generalized abdominal pain: Secondary | ICD-10-CM

## 2023-08-09 ENCOUNTER — Ambulatory Visit
Admission: RE | Admit: 2023-08-09 | Discharge: 2023-08-09 | Disposition: A | Discharge status: Home / Self Care | Payer: 59 | Source: Ambulatory Visit | Attending: Family Medicine | Admitting: Family Medicine

## 2023-08-09 DIAGNOSIS — R1084 Generalized abdominal pain: Secondary | ICD-10-CM
